# Patient Record
Sex: Male | Born: 2005 | Hispanic: No | Marital: Single | Smoking: Never smoker
Health system: Southern US, Community
[De-identification: ages and names within clinical notes are randomized; demographics above are authoritative.]

## PROBLEM LIST (undated history)

## (undated) DIAGNOSIS — T7840XA Allergy, unspecified, initial encounter: Secondary | ICD-10-CM

## (undated) DIAGNOSIS — J45909 Unspecified asthma, uncomplicated: Secondary | ICD-10-CM

## (undated) HISTORY — PX: TYMPANOSTOMY TUBE PLACEMENT: SHX32

---
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
## 2023-05-18 ENCOUNTER — Emergency Department (HOSPITAL_BASED_OUTPATIENT_CLINIC_OR_DEPARTMENT_OTHER): Payer: BC Managed Care – PPO

## 2023-05-18 ENCOUNTER — Encounter (HOSPITAL_BASED_OUTPATIENT_CLINIC_OR_DEPARTMENT_OTHER): Payer: Self-pay

## 2023-05-18 ENCOUNTER — Observation Stay (HOSPITAL_BASED_OUTPATIENT_CLINIC_OR_DEPARTMENT_OTHER)
Admission: EM | Admit: 2023-05-18 | Discharge: 2023-05-20 | Disposition: A | Discharge status: Home / Self Care | Payer: BC Managed Care – PPO | Attending: Internal Medicine | Admitting: Internal Medicine

## 2023-05-18 ENCOUNTER — Other Ambulatory Visit: Payer: Self-pay

## 2023-05-18 DIAGNOSIS — Z91010 Allergy to peanuts: Secondary | ICD-10-CM | POA: Insufficient documentation

## 2023-05-18 DIAGNOSIS — J982 Interstitial emphysema: Secondary | ICD-10-CM

## 2023-05-18 DIAGNOSIS — Z1152 Encounter for screening for COVID-19: Secondary | ICD-10-CM | POA: Diagnosis not present

## 2023-05-18 DIAGNOSIS — Z7289 Other problems related to lifestyle: Secondary | ICD-10-CM

## 2023-05-18 DIAGNOSIS — J939 Pneumothorax, unspecified: Secondary | ICD-10-CM | POA: Diagnosis not present

## 2023-05-18 DIAGNOSIS — J45901 Unspecified asthma with (acute) exacerbation: Secondary | ICD-10-CM | POA: Diagnosis not present

## 2023-05-18 DIAGNOSIS — R0602 Shortness of breath: Secondary | ICD-10-CM | POA: Diagnosis present

## 2023-05-18 DIAGNOSIS — Z79899 Other long term (current) drug therapy: Secondary | ICD-10-CM | POA: Insufficient documentation

## 2023-05-18 DIAGNOSIS — U070 Vaping-related disorder: Secondary | ICD-10-CM | POA: Insufficient documentation

## 2023-05-18 LAB — CBC
HCT: 42.5 % (ref 39.0–52.0)
Hemoglobin: 14.1 g/dL (ref 13.0–17.0)
MCH: 30.1 pg (ref 26.0–34.0)
MCHC: 33.2 g/dL (ref 30.0–36.0)
MCV: 90.8 fL (ref 80.0–100.0)
Platelets: 224 10*3/uL (ref 150–400)
RBC: 4.68 MIL/uL (ref 4.22–5.81)
RDW: 12.7 % (ref 11.5–15.5)
WBC: 10.2 10*3/uL (ref 4.0–10.5)
nRBC: 0 % (ref 0.0–0.2)

## 2023-05-18 LAB — COMPREHENSIVE METABOLIC PANEL
ALT: 20 U/L (ref 0–44)
AST: 36 U/L (ref 15–41)
Albumin: 4.6 g/dL (ref 3.5–5.0)
Alkaline Phosphatase: 115 U/L (ref 38–126)
Anion gap: 9 (ref 5–15)
BUN: 14 mg/dL (ref 6–20)
CO2: 26 mmol/L (ref 22–32)
Calcium: 9.5 mg/dL (ref 8.9–10.3)
Chloride: 102 mmol/L (ref 98–111)
Creatinine, Ser: 1.21 mg/dL (ref 0.61–1.24)
GFR, Estimated: >60 mL/min (ref >60)
Glucose, Bld: 100 mg/dL — ABNORMAL HIGH (ref 70–99)
Potassium: 4 mmol/L (ref 3.5–5.1)
Sodium: 137 mmol/L (ref 135–145)
Total Bilirubin: 1 mg/dL (ref 0.3–1.2)
Total Protein: 7.6 g/dL (ref 6.5–8.1)

## 2023-05-18 LAB — HIV ANTIBODY (ROUTINE TESTING W REFLEX): HIV Screen 4th Generation wRfx: NONREACTIVE

## 2023-05-18 LAB — RESPIRATORY PANEL BY PCR

## 2023-05-18 LAB — SARS CORONAVIRUS 2 BY RT PCR: SARS Coronavirus 2 by RT PCR: NEGATIVE

## 2023-05-18 MED ORDER — SODIUM CHLORIDE 0.9 % IV SOLN: INTRAVENOUS | Status: DC

## 2023-05-18 MED ORDER — ACETAMINOPHEN 325 MG PO TABS: 650.0000 mg | ORAL_TABLET | Freq: Four times a day (QID) | ORAL | Status: DC | PRN

## 2023-05-18 MED ORDER — KETOROLAC TROMETHAMINE 30 MG/ML IJ SOLN: 30.0000 mg | Freq: Four times a day (QID) | INTRAMUSCULAR | Status: DC | PRN

## 2023-05-18 MED ORDER — LORATADINE 10 MG PO TABS
10.0000 mg | ORAL_TABLET | Freq: Every evening | ORAL | Status: DC
  Administered 2023-05-19: 10 mg via ORAL
  Filled 2023-05-18: qty 1

## 2023-05-18 MED ORDER — ACETAMINOPHEN 650 MG RE SUPP: 650.0000 mg | Freq: Four times a day (QID) | RECTAL | Status: DC | PRN

## 2023-05-18 MED ORDER — ALBUTEROL SULFATE (2.5 MG/3ML) 0.083% IN NEBU
2.5000 mg | INHALATION_SOLUTION | RESPIRATORY_TRACT | Status: DC | PRN
  Administered 2023-05-18 – 2023-05-19 (×2): 2.5 mg via RESPIRATORY_TRACT
  Filled 2023-05-18 (×3): qty 3

## 2023-05-18 MED ORDER — LEVOCETIRIZINE DIHYDROCHLORIDE 5 MG PO TABS: 5.0000 mg | ORAL_TABLET | Freq: Every evening | ORAL | Status: DC

## 2023-05-18 MED ORDER — PREDNISONE 20 MG PO TABS
40.0000 mg | ORAL_TABLET | Freq: Every day | ORAL | Status: DC
  Administered 2023-05-19 – 2023-05-20 (×2): 40 mg via ORAL
  Filled 2023-05-18 (×2): qty 2

## 2023-05-18 MED ORDER — IOHEXOL 300 MG/ML  SOLN
75.0000 mL | Freq: Once | INTRAMUSCULAR | Status: AC | PRN
  Administered 2023-05-18: 75 mL via INTRAVENOUS

## 2023-05-18 MED ORDER — BUDESONIDE 0.5 MG/2ML IN SUSP
0.5000 mg | Freq: Two times a day (BID) | RESPIRATORY_TRACT | Status: DC
  Administered 2023-05-18 – 2023-05-20 (×5): 0.5 mg via RESPIRATORY_TRACT
  Filled 2023-05-18 (×5): qty 2

## 2023-05-18 MED ORDER — ENOXAPARIN SODIUM 40 MG/0.4ML IJ SOSY
40.0000 mg | PREFILLED_SYRINGE | INTRAMUSCULAR | Status: DC
  Filled 2023-05-18 (×2): qty 0.4

## 2023-05-18 MED ORDER — SODIUM CHLORIDE 0.9% FLUSH
3.0000 mL | Freq: Two times a day (BID) | INTRAVENOUS | Status: DC
  Administered 2023-05-18 – 2023-05-19 (×2): 3 mL via INTRAVENOUS

## 2023-05-18 MED ORDER — ONDANSETRON HCL 4 MG PO TABS: 4.0000 mg | ORAL_TABLET | Freq: Four times a day (QID) | ORAL | Status: DC | PRN

## 2023-05-18 MED ORDER — ONDANSETRON HCL 4 MG/2ML IJ SOLN: 4.0000 mg | Freq: Four times a day (QID) | INTRAMUSCULAR | Status: DC | PRN

## 2023-05-18 NOTE — ED Triage Notes (Signed)
Pt states he has a hx of asthma, was seen in [place] ([facility]) PTA He received a steroid injection and albuterol nebs. On the way home Mother was called to bring him to ED for CT  dx with pneumomediastinum. Pt states breathing is better

## 2023-05-18 NOTE — Progress Notes (Signed)
Patient refused his lovenox subcutaneous shot. Patient and mom was educated on their importance and indication. Patient verbalized understanding of education and states that he will walk more often and will walk in the hallway more often. [name], DO was notified.   Lisbeth Puller

## 2023-05-18 NOTE — Progress Notes (Signed)
     [address]       [place] [address]             [phone]       Full consult to follow CT reviewed 17yo male with pneumomediastinum.  Recs: Esophagram to rule out perforation.  If negative, PO challenge.

## 2023-05-18 NOTE — Care Management (Signed)
Transition of Care [facility]) Screening Note   Patient Details  Name: Naiden Barstow Date of Birth: 26-Sep-2005   Transition of Care [facility]) CM/SW Contact:    Lockie Pares, RN Phone Number: 05/18/2023, 12:58 PM    Transition of Care Department [facility]) has reviewed patient and no TOC needs have been identified at this time. We will continue to monitor patient advancement through interdisciplinary progression rounds. If new patient transition needs arise, please place a TOC consult.

## 2023-05-18 NOTE — ED Notes (Signed)
Consult Hosptialist to [facility] @ 05:47 am

## 2023-05-18 NOTE — ED Provider Notes (Signed)
Admitted to Dr Katrinka Blazing hospitalist - plan for observation admission for trace PTX and pneumomediastinum, on telemetry.  O2 sat okay here.   Terald Sleeper, MD 05/18/23 [phone]

## 2023-05-18 NOTE — Progress Notes (Signed)
   05/18/23 1500  Spiritual Encounters  Type of Visit Initial  Care provided to: Pt and family  Referral source Patient request  Reason for visit Routine spiritual support  OnCall Visit No   Ch responded to request for emotional and spiritual support. Pt's mother was at bedside. Pt said he  is feeling better now. It was a scary moment for the family. Pt's mother has not had any sleep since yesterday. Ch provided hospitality and asked guided questions to bring forth feelings. Ch remains available when needed.   Chaplain Val Eydan Chianese, M.Div.

## 2023-05-18 NOTE — Plan of Care (Signed)

## 2023-05-18 NOTE — H&P (Signed)
History and Physical    Patient: Miguel Bond ZOX:[id number] DOB: Oct 04, 2005 DOA: 05/18/2023 DOS: the patient was seen and examined on 05/18/2023 PCP: Garey Ham, MD  Patient coming from: Transfer from [facility]  Chief Complaint:  Chief Complaint  Patient presents with   Shortness of Breath   Chest Pain   HPI: Miguel Bond is a 17 y.o. male with medical history significant of asthma who presents with complaints of shortness of breath.  Symptoms started yesterday evening at around 7 PM.  He reported having acute onset of chest tightness along with shortness of breath and wheezing.  He had been having mild intermittent cough and wheezing.  He had tried his inhaler along with several breathing treatments at home without improvement in symptoms.  He was initially seen at [facility] emergency department and given steroid shot and breathing treatment with some improvement.  He had initially been discharged.  However, shortly thereafter he was called about the chest x-ray showing concern for pneumomediastinum.  He was advised to go to emergency department immediately.  Patient does admit to intermittent vape use and marijuana use.  Denies having any recent fevers, nausea, vomiting, diarrhea, or dysuria symptoms.  Upon further questioning patient did make note that he had gotten hit during football with with his neck strap.  In [facility] department at [facility] center patient was noted to have stable vital signs.  Labs are unremarkable.  CT chest confirms moderate volume pneumomediastinum thought secondary to small pneumothorax versus esophageal tear.  Possibly brought on secondary to patient coughing.  Cardiothoracic surgery have been consulted.  Review of Systems: As mentioned in the history of present illness. All other systems reviewed and are negative. Past Medical History:  Diagnosis Date   Allergy    Asthma    Past Surgical History:  Procedure Laterality Date   ORIF ORBITAL FRACTURE Left  03/05/2021   Procedure: OPEN REDUCTION INTERNAL FIXATION LEFT ZYGOMATICOMAXILLARY COMPLEX FRACTURE AND CLOSED REDUCTION OF LEFT ORBITAL FRACTURE;  Surgeon: Laren Boom, DO;  Location: [facility] OR;  Service: ENT;  Laterality: Left;   TYMPANOSTOMY TUBE PLACEMENT     17 yo   Social History:  reports that he has never smoked. He has never been exposed to tobacco smoke. He has never used smokeless tobacco. He reports current drug use. Drug: Marijuana. He reports that he does not drink alcohol.  Allergies  Allergen Reactions   Other Hives and Anaphylaxis    TREE NUTS   Peanut-Containing Drug Products Anaphylaxis and Hives    History reviewed. No pertinent family history.  Prior to Admission medications   Medication Sig Start Date End Date Taking? Authorizing Provider  acetaminophen (TYLENOL) 500 MG tablet Take 500 mg by mouth 2 (two) times daily as needed for moderate pain.    [provider]  albuterol (PROVENTIL) (2.5 MG/3ML) 0.083% nebulizer solution Take 2.5 mg by nebulization every 6 (six) hours as needed for wheezing or shortness of breath.    [provider]  albuterol (VENTOLIN HFA) 108 (90 Base) MCG/ACT inhaler Inhale 2 puffs into the lungs every 6 (six) hours as needed for wheezing or shortness of breath.    [provider]  beclomethasone (QVAR) 80 MCG/ACT inhaler Inhale 1 puff into the lungs 2 (two) times daily.    [provider]  EPINEPHrine 0.3 mg/0.3 mL IJ SOAJ injection Inject 0.3 mg into the muscle Once PRN.    [provider]  loratadine (CLARITIN) 10 MG tablet Take 10 mg by  mouth daily.    [provider]  Multiple Vitamins-Minerals (MULTIVITAMIN WITH MINERALS) tablet Take 1 tablet by mouth daily.    [provider]    Physical Exam: Vitals:   05/18/23 0535 05/18/23 0630 05/18/23 1037 05/18/23 1210  BP: 133/83 (!) 137/54 (!) 144/90 123/78  Pulse: 87 81 67 62  Resp: 14 14 13 17   Temp:  98.2 F (36.8 C) 98.5  F (36.9 C) 98.4 F (36.9 C)  TempSrc:  Oral Oral Oral  SpO2: 95% 96% 98% 95%  Weight:      Height:       Constitutional: Young male currently in no acute distress. Eyes: PERRL, lids and conjunctivae normal ENMT: Mucous membranes are moist. Posterior pharynx clear of any exudate or lesions.Normal dentition.  Neck: normal, supple, no masses, no thyromegaly Respiratory: clear to auscultation bilaterally, no wheezing, no crackles. Normal respiratory effort. No accessory muscle use.  Cardiovascular: Regular rate and rhythm, no murmurs / rubs / gallops. No extremity edema. 2+ pedal pulses. No carotid bruits.  Abdomen: no tenderness, no masses palpated. No hepatosplenomegaly. Bowel sounds positive.  Musculoskeletal: no clubbing / cyanosis. No joint deformity upper and lower extremities. Good ROM, no contractures. Normal muscle tone.  Skin: no rashes, lesions, ulcers. No induration Neurologic: CN 2-12 grossly intact. Sensation intact, DTR normal. Strength 5/5 in all 4.  Psychiatric: Normal judgment and insight. Alert and oriented x 3. Normal mood.   Data Reviewed:  Reviewed labs, imaging, and pertinent records as documented.  Assessment and Plan: Pneumomediastinum  Atraumatic pneumothorax Acute.  Patient present with complaints of shortness of breath, wheezing, and chest tightness.  Found to have concern for moderate mediastinum with trace left apicomedial pneumothorax appreciated, and slightly air distention of the esophagus although small perforation of the esophagus to be missed on imaging.  Cardiothoracic surgery have been consulted and recommended barium swallow.. -Admit to medical telemetry bed -Continuous pulse oximetry with nasal cannula oxygen -Incentive spirometry -N.p.o. for need of barium swallow -Cardiothoracic surgery consulted,   will follow-up for any further recommendations  Asthma exacerbation Patient reported having wheezing with cough and shortness of breath yesterday.   He has been given steroids and breathing treatments prior to arrival.  No significant wheezing appreciated on physical exam at this time. -Continue albuterol nebs as needed for shortness of breath/wheezing -Brovana nebs  Vape use Patient reports intermittent vape use. -Counseled patient on need of cessation of vape use.   DVT prophylaxis: Lovenox Advance Care Planning:   Code Status: Full Code    Consults: Cardiothoracic surgery  Family Communication: Patient's mother updated at patient's bedside  Severity of Illness: The appropriate patient status for this patient is OBSERVATION. Observation status is judged to be reasonable and necessary in order to provide the required intensity of service to ensure the patient's safety. The patient's presenting symptoms, physical exam findings, and initial radiographic and laboratory data in the context of their medical condition is felt to place them at decreased risk for further clinical deterioration. Furthermore, it is anticipated that the patient will be medically stable for discharge from the hospital within 2 midnights of admission.   Author: Clydie Braun, MD 05/18/2023 12:18 PM  For on call review [email or web address].

## 2023-05-18 NOTE — Consult Note (Signed)
[address]       [place] [address]             [phone]        Miguel Bond [facility] Medical Record #[id number] Date of Birth: Nov 13, 2005  Referring: [facility] Primary Care: Dial, Jon Billings, MD Primary Cardiologist:None  Chief Complaint:    Chief Complaint  Patient presents with   Shortness of Breath   Chest Pain   History of Present Illness:      Miguel Bond os a 17 yo male with history of Asthma.  He was evaluated at [facility].  He was treated with a steroid injection and nebulizer therapy.  After discharge his mother was contacted and instructed to bring the patient to the ED due to the finding of pneumomediastinum on his CXR.  He presented to [facility] ED for evaluation.  Upon arrival he felt his breathing was better.  CT scan was obtained and confirmed the presence of pneumomediastinum, however esophageal perforation can not be ruled out.  The patient denies any N/V.  He did state that he got hit in the neck with a chin strap at football practice yesterday.  Current Activity/ Functional Status: Patient is independent with mobility/ambulation, transfers, ADL's, IADL's.   Past Medical History:  Diagnosis Date   Allergy    Asthma     Past Surgical History:  Procedure Laterality Date   ORIF ORBITAL FRACTURE Left 03/05/2021   Procedure: OPEN REDUCTION INTERNAL FIXATION LEFT ZYGOMATICOMAXILLARY COMPLEX FRACTURE AND CLOSED REDUCTION OF LEFT ORBITAL FRACTURE;  Surgeon: Laren Boom, DO;  Location: [facility] OR;  Service: ENT;  Laterality: Left;   TYMPANOSTOMY TUBE PLACEMENT     17 yo    Social History   Tobacco Use  Smoking Status Never   Passive exposure: Never  Smokeless Tobacco Never    Social History   Substance and Sexual Activity  Alcohol Use Never     Allergies  Allergen Reactions   Other Hives and Anaphylaxis    TREE NUTS   Peanut-Containing Drug Products Anaphylaxis and Hives    Current  Facility-Administered Medications  Medication Dose Route Frequency Provider Last Rate Last Admin   acetaminophen (TYLENOL) tablet 650 mg  650 mg Oral Q6H PRN Clydie Braun, MD       Or   acetaminophen (TYLENOL) suppository 650 mg  650 mg Rectal Q6H PRN Madelyn Flavors A, MD       albuterol (PROVENTIL) (2.5 MG/3ML) 0.083% nebulizer solution 2.5 mg  2.5 mg Nebulization Q2H PRN Katrinka Blazing, Rondell A, MD   2.5 mg at 05/18/23 1254   enoxaparin (LOVENOX) injection 40 mg  40 mg Subcutaneous Q24H Smith, Rondell A, MD       ketorolac (TORADOL) 30 MG/ML injection 30 mg  30 mg Intravenous Q6H PRN Katrinka Blazing, Rondell A, MD       ondansetron (ZOFRAN) tablet 4 mg  4 mg Oral Q6H PRN Madelyn Flavors A, MD       Or   ondansetron (ZOFRAN) injection 4 mg  4 mg Intravenous Q6H PRN Smith, Rondell A, MD       sodium chloride flush (NS) 0.9 % injection 3 mL  3 mL Intravenous Q12H Smith, Rondell A, MD   3 mL at 05/18/23 1342    Medications Prior to Admission  Medication Sig Dispense Refill Last Dose   albuterol (PROVENTIL) (2.5 MG/3ML) 0.083% nebulizer solution Take 2.5 mg by nebulization every 6 (six)  hours as needed for wheezing or shortness of breath.   05/18/2023   albuterol (VENTOLIN HFA) 108 (90 Base) MCG/ACT inhaler Inhale 2 puffs into the lungs every 6 (six) hours as needed for wheezing or shortness of breath.   05/17/2023   EPINEPHrine 0.3 mg/0.3 mL IJ SOAJ injection Inject 0.3 mg into the muscle Once PRN.   Unk   levocetirizine (XYZAL) 5 MG tablet Take 5 mg by mouth every evening.   05/17/2023   beclomethasone (QVAR) 80 MCG/ACT inhaler Inhale 1 puff into the lungs 2 (two) times daily.      predniSONE (DELTASONE) 20 MG tablet Take 40 mg by mouth daily.       History reviewed. No pertinent family history.   Review of Systems:   ROS    Cardiac Review of Systems: Y or  [    ]= no  Chest Pain [ Y, improved   ]  Resting SOB [   ] Exertional SOB  [  ]  Orthopnea [  ]   Pedal Edema [   ]    Palpitations [  ] Syncope   [  ]   Presyncope [   ]  General Review of Systems: [Y] = yes [  ]=no Constitional: recent weight change [  ]; anorexia [  ]; fatigue [ N ]; nausea Miguel Bond  ]; night sweats [  ]; fever Miguel Bond  ]; or chills [  ]                                                               Dental: Last Dentist visit:   Eye : blurred vision [  ]; diplopia [   ]; vision changes [  ];  Amaurosis fugax[  ]; Resp: cough [ Y, resolved ];  wheezing[ resolved  ];  hemoptysis[  ]; shortness of breath[ resolved ]; paroxysmal nocturnal dyspnea[  ]; dyspnea on exertion[  ]; or orthopnea[  ];  GI:  gallstones[  ], vomiting[N  ];  dysphagia[  ]; melena[  ];  hematochezia [  ]; heartburn[  ];   Hx of  Colonoscopy[  ]; GU: kidney stones [  ]; hematuria[  ];   dysuria [  ];  nocturia[  ];  history of     obstruction [  ]; urinary frequency [  ]             Skin: rash, swelling[ N ];, hair loss[  ];  peripheral edema[  ];  or itching[  ]; Musculosketetal: myalgias[  ];  joint swelling[  ];  joint erythema[  ];  joint pain[  ];  back pain[  ];  Heme/Lymph: bruising[  ];  bleeding[  ];  anemia[  ];  Neuro: TIA[  ];  headaches[  ];  stroke[  ];  vertigo[  ];  seizures[  ];   paresthesias[  ];  difficulty walking[  ];  Psych:depression[  ]; anxiety[  ];    Physical Exam: BP 123/78 (BP Location: Left Arm)   Pulse 62   Temp 98.4 F (36.9 C) (Oral)   Resp 17   Ht 6\' 1"  (1.854 m)   Wt 79.4 kg   SpO2 95%   BMI 23.09 kg/m    General appearance: alert, cooperative,  and no distress Head: Normocephalic, without obvious abnormality Resp: clear to auscultation bilaterally Cardio: regular rate and rhythm Neurologic: Grossly normal  Diagnostic Studies & Laboratory data:     Recent Radiology Findings:   CT Chest W Contrast  Result Date: 05/18/2023 CLINICAL DATA:  Recent diagnosis of pneumomediastinum. Possible esophageal perforation. Shortness of breath and chest pain. 17 year old male. EXAM: CT CHEST WITH CONTRAST TECHNIQUE: Multidetector  CT imaging of the chest was performed during intravenous contrast administration. RADIATION DOSE REDUCTION: This exam was performed according to the departmental dose-optimization program which includes automated exposure control, adjustment of the mA and/or kV according to patient size and/or use of iterative reconstruction technique. CONTRAST:  75mL OMNIPAQUE IOHEXOL 300 MG/ML  SOLN COMPARISON:  Last PA and lateral chest was 12/01/2020. No prior cross-sectional imaging for comparison. FINDINGS: Cardiovascular: No significant vascular findings. Normal heart size. No pericardial effusion. Mediastinum/Nodes: There is moderate pneumomediastinum outlining structures in the anterior, superior and the middle mediastinum including surrounding the thoracic esophagus, the intrathoracic great vessels and both main bronchi as well as tracking into the prevascular space with trace amount anterior to the heart. Source of the air is indeterminate. The walls of the trachea and main bronchi are grossly intact. There is no visible defect in the esophagus which is slightly air distended, although a small perforation could be occult on imaging. Lungs/Pleura: No pleural effusion, thickening or pneumothorax. Trace left apicomedial pneumothorax is noted involving approximately 1% of the left chest volume, without mediastinal shift. No right pneumothorax is seen. There is diffuse bronchial thickening. There are small areas of ground-glass disease in the base of the right middle lobe medially and in the medial basal left lower lobe, which could be areas of ground-glass pneumonitis or post pneumonic ground-glass scarring. If there was history of recent chest trauma these could be areas of contusion. The remaining lungs are otherwise entirely clear. Upper Abdomen: No acute abnormality. No free air is seen in the upper abdomen. Musculoskeletal: No regional skeletal fracture is evident. No other significant osseous findings. The chest wall is  unremarkable. IMPRESSION: 1. Moderate pneumomediastinum outlining the thoracic esophagus, the intrathoracic great vessels and both main bronchi as well as tracking into the prevascular space with trace amount anterior to the heart. Source of the air is indeterminate. No visible defect in the esophagus which is slightly air distended, although a small perforation could be occult on imaging. 2. Trace left apicomedial pneumothorax involving approximately 1% of the left chest volume, without mediastinal shift. 3. Small areas of ground-glass disease in the base of the right middle lobe medially and in the medial basal left lower lobe, which could be areas of ground-glass pneumonitis or post pneumonic ground-glass scarring. If there was history of recent chest trauma these could be areas of contusion. 4. Diffuse bronchial thickening. Electronically Signed   By: Almira Bar M.D.   On: 05/18/2023 05:06     I have independently reviewed the above radiologic studies and discussed with the patient   Recent Lab Findings: Lab Results  Component Value Date   WBC 10.2 05/18/2023   HGB 14.1 05/18/2023   HCT 42.5 05/18/2023   PLT 224 05/18/2023   GLUCOSE 100 (H) 05/18/2023   ALT 20 05/18/2023   AST 36 05/18/2023   NA 137 05/18/2023   K 4.0 05/18/2023   CL 102 05/18/2023   CREATININE 1.21 05/18/2023   BUN 14 05/18/2023   CO2 26 05/18/2023      Assessment / Plan:  Pneumomediastinum-likely due to asthma exacerbation.Marland Kitchen However, keep patient NPO, will need Esophogram to rule out esophageal injury.  If this is negative, okay to advance diet, no surgical intervention indicated at this time  I  spent 20 minutes counseling the patient face to face.   Lowella Dandy, PA-C 05/18/2023 3:18 PM

## 2023-05-18 NOTE — Plan of Care (Signed)
Transfer from [facility]. Miguel Bond is a 17 year old male with PMH of asthma who presented with complaints of shortness of breath and tightness in his chest.  Labs are unremarkable.  CT chest confirms moderate volume pneumomediastinum thought secondary to small pneumothorax.  Possibly brought on secondary to patient coughing.  Cardiothoracic surgery had been consulted Dr. Leafy Ro.  Recommended observation.  Please notify cardiothoracic surgery upon arrival.  Orders placed for oxygen patient in a medical telemetry bed

## 2023-05-18 NOTE — ED Provider Notes (Signed)
[facility] Emergency Department Provider Note MRN:  [id number]  Arrival date & time: 05/18/23     Chief Complaint   Shortness of Breath and Chest Pain   History of Present Illness   Gracin Daws is a 17 y.o. year-old male with a history of asthma presenting to the ED with chief complaint of shortness of breath.  Shortness of breath and chest tightness starting yesterday.  Was treated at an emergency department in [place], given breathing treatments and a steroid shot.  Discharged home but on the way home was called back by the emergency department staff, told that his x-ray showed pneumomediastinum, advised him to go back to the emergency department for CT imaging.  Review of Systems  A thorough review of systems was obtained and all systems are negative except as noted in the HPI and PMH.   Patient's Health History    Past Medical History:  Diagnosis Date   Allergy    Asthma     Past Surgical History:  Procedure Laterality Date   ORIF ORBITAL FRACTURE Left 03/05/2021   Procedure: OPEN REDUCTION INTERNAL FIXATION LEFT ZYGOMATICOMAXILLARY COMPLEX FRACTURE AND CLOSED REDUCTION OF LEFT ORBITAL FRACTURE;  Surgeon: Laren Boom, DO;  Location: [facility] OR;  Service: ENT;  Laterality: Left;   TYMPANOSTOMY TUBE PLACEMENT     17 yo    History reviewed. No pertinent family history.  Social History   Socioeconomic History   Marital status: Single    Spouse name: Not on file   Number of children: Not on file   Years of education: Not on file   Highest education level: Not on file  Occupational History   Not on file  Tobacco Use   Smoking status: Never    Passive exposure: Never   Smokeless tobacco: Never  Vaping Use   Vaping status: Every Day  Substance and Sexual Activity   Alcohol use: Never   Drug use: Yes    Types: Marijuana    Comment: occ   Sexual activity: Not on file  Other Topics Concern   Not on file  Social History Narrative    Not on file   Social Determinants of Health   Financial Resource Strain: Not on file  Food Insecurity: Not on file  Transportation Needs: Not on file  Physical Activity: Not on file  Stress: Not on file  Social Connections: Not on file  Intimate Partner Violence: Not on file     Physical Exam   Vitals:   05/18/23 0345 05/18/23 0535  BP: 137/75 133/83  Pulse: 61 87  Resp: 18 14  Temp:    SpO2: 100% 95%    CONSTITUTIONAL: Well-appearing, NAD NEURO/PSYCH:  Alert and oriented x 3, no focal deficits EYES:  eyes equal and reactive ENT/NECK:  no LAD, no JVD CARDIO: Regular rate, well-perfused, normal S1 and S2 PULM:  CTAB no wheezing or rhonchi GI/GU:  non-distended, non-tender MSK/SPINE:  No gross deformities, no edema SKIN:  no rash, atraumatic   *Additional and/or pertinent findings included in MDM below  Diagnostic and Interventional Summary    EKG Interpretation Date/Time:  Thursday May 18 2023 02:24:59 EDT Ventricular Rate:  83 PR Interval:  130 QRS Duration:  80 QT Interval:  349 QTC Calculation: 410 R Axis:   86  Text Interpretation: Sinus arrhythmia Confirmed by Kennis Carina [phone]) on 05/18/2023 4:07:44 AM       Labs Reviewed  COMPREHENSIVE METABOLIC PANEL - Abnormal; Notable for the following  components:      Result Value   Glucose, Bld 100 (*)    All other components within normal limits  CBC    CT Chest W Contrast  Final Result      Medications  iohexol (OMNIPAQUE) 300 MG/ML solution 75 mL (75 mLs Intravenous Contrast Given 05/18/23 0413)     Procedures  /  Critical Care Procedures  ED Course and Medical Decision Making  Initial Impression and Ddx Patient is sitting comfortably in no acute distress with normal vital signs, lungs are largely clear.  Differential diagnosis includes pneumomediastinum, pneumothorax, awaiting labs, CT.  Past medical/surgical history that increases complexity of ED encounter: Asthma  Interpretation  of Diagnostics I personally reviewed the EKG and my interpretation is as follows: Sinus rhythm  Labs overall reassuring with no significant blood count or electrolyte disturbance.  CT imaging confirms moderate volume pneumomediastinum.  Patient Reassessment and Ultimate Disposition/Management     Case discussed with Dr. Leafy Ro of CT surgery, given the history and CT appearance likely all stemming from a small pneumothorax.  Not much to do other than hospitalist admission for observation.  Awaiting hospitalist formal acceptance.  Patient management required discussion with the following services or consulting groups:  Hospitalist Service  Complexity of Problems Addressed Acute illness or injury that poses threat of life of bodily function  Additional Data Reviewed and Analyzed Further history obtained from: Further history from spouse/family member  Additional Factors Impacting ED Encounter Risk Consideration of hospitalization  Elmer Sow. Pilar Plate, MD [facility] Emergency [facility] [email or web address]  Final Clinical Impressions(s) / ED Diagnoses     ICD-10-CM   1. Pneumomediastinum (HCC)  J98.2       ED Discharge Orders     None        Discharge Instructions Discussed with and Provided to Patient:   Discharge Instructions   None      Sabas Sous, MD 05/18/23 [phone]

## 2023-05-19 ENCOUNTER — Observation Stay (HOSPITAL_COMMUNITY): Payer: BC Managed Care – PPO

## 2023-05-19 DIAGNOSIS — J982 Interstitial emphysema: Secondary | ICD-10-CM | POA: Diagnosis not present

## 2023-05-19 LAB — CBC
HCT: 43.7 % (ref 39.0–52.0)
Hemoglobin: 14.2 g/dL (ref 13.0–17.0)
MCH: 29.6 pg (ref 26.0–34.0)
MCHC: 32.5 g/dL (ref 30.0–36.0)
MCV: 91.2 fL (ref 80.0–100.0)
Platelets: 236 10*3/uL (ref 150–400)
RBC: 4.79 MIL/uL (ref 4.22–5.81)
RDW: 12.7 % (ref 11.5–15.5)
WBC: 6.5 10*3/uL (ref 4.0–10.5)
nRBC: 0 % (ref 0.0–0.2)

## 2023-05-19 NOTE — Plan of Care (Signed)

## 2023-05-19 NOTE — Progress Notes (Signed)
Patient refused red med (Lovenox subcutaneous). Patient was educated on their indications, importance and their risks. Patient verbalized understanding. [name], DO was notified.   Miguel Bond

## 2023-05-19 NOTE — Progress Notes (Signed)
PROGRESS NOTE  Miguel Bond  WGN:[id number] DOB: 2006-04-06 DOA: 05/18/2023 PCP: Garey Ham, MD   Brief Narrative: Patient is a 17 year old male with history of asthma who presented with shortness of breath, acute onset of chest tightness, wheezing, intermittent cough.  Chest x-ray done as an outpatient was concerning for pneumomediastinum and was advised to go to the emergency department.He has  history of intermittent vaping use/marijuana use.  On presentation, he was hemodynamically stable .  CT chest confirmed moderate volume pneumomediastinum, small pneumothorax on the left.  Cardiothoracic surgery consulted.  Esophagram did not show any abnormalities.  Diet restarted.  Plan for monitoring today, possible discharge tomorrow after cardiothoracic surgery clearance  Assessment & Plan:  Principal Problem:   Pneumomediastinum (HCC) Active Problems:   Pneumothorax   Asthma exacerbation   Vapes non-nicotine containing substance  Pneumomediastinum/traumatic pneumothorax: Presented with shortness of breath, wheezing, chest tightness.  Chest CT showed moderate pneumomediastinum, trace apical pneumothorax.  Etiology unclear.  Cardiothoracic surgery consulted and following.   Esophagram did not show any abnormalities.  Diet restarted.  Plan for monitoring today, possible discharge tomorrow after cardiothoracic surgery clearance. He is completely asymptomatic and on room air.  Asthma exacerbation: Presented with cough, wheezing, shortness of breath.  Continue incentive centimeter, breathing treatment.  Currently not wheezing.  Intermittent vape/marijuana use: Counseled for cessation         DVT prophylaxis:enoxaparin (LOVENOX) injection 40 mg Start: 05/18/23 2200     Code Status: Full Code  Family Communication: Discussed with mother at bedside  Patient status: Obs  Patient is from :Home  Anticipated discharge [facility]  Estimated DC date:tomorrow   Consultants:  CTCS  Procedures:None  Antimicrobials:  Anti-infectives (From admission, onward)    None       Subjective: Patient seen and examined at bedside today.  Hemodynamically stable on room air.  Any shortness of breath or cough.  Very comfortable.  Objective: Vitals:   05/18/23 2047 05/18/23 2324 05/19/23 0434 05/19/23 0556  BP:  126/60 126/73   Pulse:  (!) 57 64   Resp:      Temp:  98 F (36.7 C) 98.3 F (36.8 C)   TempSrc:  Oral Oral   SpO2: 96% 96% 95% 98%  Weight:      Height:        Intake/Output Summary (Last 24 hours) at 05/19/2023 0739 Last data filed at 05/19/2023 0731 Gross per 24 hour  Intake 859.65 ml  Output --  Net 859.65 ml   Filed Weights   05/18/23 0231  Weight: 79.4 kg    Examination:  General exam: Overall comfortable, not in distress HEENT: PERRL Respiratory system:  no wheezes or crackles  Cardiovascular system: S1 & S2 heard, RRR.  Gastrointestinal system: Abdomen is nondistended, soft and nontender. Central nervous system: Alert and oriented Extremities: No edema, no clubbing ,no cyanosis Skin: No rashes, no ulcers,no icterus     Data Reviewed: I have personally reviewed following labs and imaging studies  CBC: Recent Labs  Lab 05/18/23 0300  WBC 10.2  HGB 14.1  HCT 42.5  MCV 90.8  PLT 224   Basic Metabolic Panel: Recent Labs  Lab 05/18/23 0300  NA 137  K 4.0  CL 102  CO2 26  GLUCOSE 100*  BUN 14  CREATININE 1.21  CALCIUM 9.5     Recent Results (from the past 240 hour(s))  Respiratory (~20 pathogens) panel by PCR     Status: Abnormal   Collection Time: 05/18/23  2:00 PM   Specimen: Nasopharyngeal Swab; Respiratory  Result Value Ref Range Status   Adenovirus NOT DETECTED NOT DETECTED Final   Coronavirus 229E NOT DETECTED NOT DETECTED Final    Comment: (NOTE) The Coronavirus on the Respiratory Panel, DOES NOT test for the novel  Coronavirus (2019 nCoV)    Coronavirus HKU1 NOT DETECTED NOT DETECTED Final    Coronavirus NL63 NOT DETECTED NOT DETECTED Final   Coronavirus OC43 NOT DETECTED NOT DETECTED Final   Metapneumovirus NOT DETECTED NOT DETECTED Final   Rhinovirus / Enterovirus DETECTED (A) NOT DETECTED Final   Influenza A NOT DETECTED NOT DETECTED Final   Influenza B NOT DETECTED NOT DETECTED Final   Parainfluenza Virus 1 NOT DETECTED NOT DETECTED Final   Parainfluenza Virus 2 NOT DETECTED NOT DETECTED Final   Parainfluenza Virus 3 NOT DETECTED NOT DETECTED Final   Parainfluenza Virus 4 NOT DETECTED NOT DETECTED Final   Respiratory Syncytial Virus NOT DETECTED NOT DETECTED Final   Bordetella pertussis NOT DETECTED NOT DETECTED Final   Bordetella Parapertussis NOT DETECTED NOT DETECTED Final   Chlamydophila pneumoniae NOT DETECTED NOT DETECTED Final   Mycoplasma pneumoniae NOT DETECTED NOT DETECTED Final    Comment: Performed at [facility], [address], [place], [place] [address]  SARS Coronavirus 2 by RT PCR (hospital order, performed in [facility] hospital lab) *cepheid single result test* Anterior Nasal Swab     Status: None   Collection Time: 05/18/23  6:02 PM   Specimen: Anterior Nasal Swab  Result Value Ref Range Status   SARS Coronavirus 2 by RT PCR NEGATIVE NEGATIVE Final    Comment: Performed at [facility], [address] [place], [place] [address]     Radiology Studies: CT Chest W Contrast  Result Date: 05/18/2023 CLINICAL DATA:  Recent diagnosis of pneumomediastinum. Possible esophageal perforation. Shortness of breath and chest pain. 17 year old male. EXAM: CT CHEST WITH CONTRAST TECHNIQUE: Multidetector CT imaging of the chest was performed during intravenous contrast administration. RADIATION DOSE REDUCTION: This exam was performed according to the departmental dose-optimization program which includes automated exposure control, adjustment of the mA and/or kV according to patient size and/or use of iterative reconstruction technique. CONTRAST:  75mL  OMNIPAQUE IOHEXOL 300 MG/ML  SOLN COMPARISON:  Last PA and lateral chest was 12/01/2020. No prior cross-sectional imaging for comparison. FINDINGS: Cardiovascular: No significant vascular findings. Normal heart size. No pericardial effusion. Mediastinum/Nodes: There is moderate pneumomediastinum outlining structures in the anterior, superior and the middle mediastinum including surrounding the thoracic esophagus, the intrathoracic great vessels and both main bronchi as well as tracking into the prevascular space with trace amount anterior to the heart. Source of the air is indeterminate. The walls of the trachea and main bronchi are grossly intact. There is no visible defect in the esophagus which is slightly air distended, although a small perforation could be occult on imaging. Lungs/Pleura: No pleural effusion, thickening or pneumothorax. Trace left apicomedial pneumothorax is noted involving approximately 1% of the left chest volume, without mediastinal shift. No right pneumothorax is seen. There is diffuse bronchial thickening. There are small areas of ground-glass disease in the base of the right middle lobe medially and in the medial basal left lower lobe, which could be areas of ground-glass pneumonitis or post pneumonic ground-glass scarring. If there was history of recent chest trauma these could be areas of contusion. The remaining lungs are otherwise entirely clear. Upper Abdomen: No acute abnormality. No free air is seen  in the upper abdomen. Musculoskeletal: No regional skeletal fracture is evident. No other significant osseous findings. The chest wall is unremarkable. IMPRESSION: 1. Moderate pneumomediastinum outlining the thoracic esophagus, the intrathoracic great vessels and both main bronchi as well as tracking into the prevascular space with trace amount anterior to the heart. Source of the air is indeterminate. No visible defect in the esophagus which is slightly air distended, although a small  perforation could be occult on imaging. 2. Trace left apicomedial pneumothorax involving approximately 1% of the left chest volume, without mediastinal shift. 3. Small areas of ground-glass disease in the base of the right middle lobe medially and in the medial basal left lower lobe, which could be areas of ground-glass pneumonitis or post pneumonic ground-glass scarring. If there was history of recent chest trauma these could be areas of contusion. 4. Diffuse bronchial thickening. Electronically Signed   By: Almira Bar M.D.   On: 05/18/2023 05:06    Scheduled Meds:  budesonide  0.5 mg Nebulization BID   enoxaparin (LOVENOX) injection  40 mg Subcutaneous Q24H   loratadine  10 mg Oral QPM   predniSONE  40 mg Oral Daily   sodium chloride flush  3 mL Intravenous Q12H   Continuous Infusions:  sodium chloride 75 mL/hr at 05/19/23 0731     LOS: 0 days   Burnadette Pop, MD [facility] P9/20/2024, 7:39 AM

## 2023-05-20 DIAGNOSIS — J982 Interstitial emphysema: Secondary | ICD-10-CM | POA: Diagnosis not present

## 2023-05-20 NOTE — Plan of Care (Signed)

## 2023-05-20 NOTE — Discharge Summary (Signed)
Physician Discharge Summary  Kary Frontino ZOX:[id number] DOB: 2006/06/16 DOA: 05/18/2023  PCP: Garey Ham, MD  Admit date: 05/18/2023 Discharge date: 05/20/2023  Admitted From: Home Disposition:  Home  Discharge Condition:Stable CODE STATUS:FULL Diet recommendation:  Regular   Brief/Interim Summary: Patient is a 17 year old male with history of asthma who presented with shortness of breath, acute onset of chest tightness, wheezing, intermittent cough.  Chest x-ray done as an outpatient was concerning for pneumomediastinum and was advised to go to the emergency department.He has  history of intermittent vaping use/marijuana use.  On presentation, he was hemodynamically stable .  CT chest confirmed moderate volume pneumomediastinum, small pneumothorax on the left.  Cardiothoracic surgery consulted.  Esophagram did not show any abnormalities.  Diet restarted and he has tolerated.  He is completely asymptomatic today.  On room air, denies any chest pain or shortness of breath.  Very eager to go home.  Cardiothoracic surgery has given clearance for discharge and he does not need follow-up.  Medically stable for discharge  Following problems were addressed during the hospitalization:  Pneumomediastinum/traumatic pneumothorax: Presented with shortness of breath, wheezing, chest tightness.  Chest CT showed moderate pneumomediastinum, trace apical pneumothorax.  Etiology unclear.  Cardiothoracic surgery consulted and  were following.   Esophagram did not show any abnormalities.  Diet restarted and he has tolerated.  He is completely asymptomatic today.  On room air, denies any chest pain or shortness of breath.  Very eager to go home.  Cardiothoracic surgery(discussed with Dr. Cliffton Asters) has given clearance for discharge and he does not need follow-up.  Medically stable for discharge   Asthma exacerbation: Presented with cough, wheezing, shortness of breath.  Currently not wheezing.  Taking prednisone,  inhalers at home  Intermittent vape/marijuana use: Counseled for cessation Discharge Diagnoses:  Principal Problem:   Pneumomediastinum (HCC) Active Problems:   Pneumothorax   Asthma exacerbation   Vapes non-nicotine containing substance    Discharge Instructions  Discharge Instructions     Diet general   Complete by: As directed    Discharge instructions   Complete by: As directed    1)Please follow up with your PCP in a week 2)Please stop vaping or smoking   Increase activity slowly   Complete by: As directed       Allergies as of 05/20/2023       Reactions   Other Hives, Anaphylaxis   TREE NUTS   Peanut-containing Drug Products Anaphylaxis, Hives        Medication List     TAKE these medications    albuterol 108 (90 Base) MCG/ACT inhaler Commonly known as: VENTOLIN HFA Inhale 2 puffs into the lungs every 6 (six) hours as needed for wheezing or shortness of breath.   albuterol (2.5 MG/3ML) 0.083% nebulizer solution Commonly known as: PROVENTIL Take 2.5 mg by nebulization every 6 (six) hours as needed for wheezing or shortness of breath.   beclomethasone 80 MCG/ACT inhaler Commonly known as: QVAR Inhale 1 puff into the lungs 2 (two) times daily.   EPINEPHrine 0.3 mg/0.3 mL Soaj injection Commonly known as: EPI-PEN Inject 0.3 mg into the muscle Once PRN.   levocetirizine 5 MG tablet Commonly known as: XYZAL Take 5 mg by mouth every evening.   predniSONE 20 MG tablet Commonly known as: DELTASONE Take 40 mg by mouth daily.        Follow-up Information     Dial, Jon Billings, MD. Schedule an appointment as soon as possible for a visit in 1  week(s).   Specialty: Pediatrics Contact information: [address] [place] [address] [phone]                Allergies  Allergen Reactions   Other Hives and Anaphylaxis    TREE NUTS   Peanut-Containing Drug Products Anaphylaxis and Hives    Consultations: Cardiothoracic  surgery   Procedures/Studies: DG ESOPHAGUS W SINGLE CM (SOL OR THIN BA)  Result Date: 05/19/2023 CLINICAL DATA:  Patient with history of asthma who developed acute chest tightness and dyspnea yesterday morning prompting ED evaluation. CXR showed possible pneumomediastinum, follow up CT chest confirms moderate volume pneumomediastinum, unable to rule out esophageal perforation. Request for esophagram to evaluate for possible esophageal tear. Per patient no chest pain, dyspnea, wheezing today. EXAM: ESOPHAGUS/BARIUM SWALLOW/TABLET STUDY TECHNIQUE: Single contrast examination was performed using thin liquid barium. This exam was performed by Lynnette Caffey, PA-C, and was supervised and interpreted by Acquanetta Belling, MD. FLUOROSCOPY: Radiation Exposure Index (as provided by the fluoroscopic device): 2.90 mGy Kerma COMPARISON:  CT chest w/contrast 05/18/23 FINDINGS: Swallowing: Appears normal. No vestibular penetration or aspiration seen. Pharynx: Unremarkable. Esophagus: Normal appearance.  No contrast extravasation. Esophageal motility: Within normal limits on problem focused exam. Hiatal Hernia: None. Gastroesophageal reflux: None visualized. Ingested 13 mm barium tablet: Not given on problem focused exam. Other: None. IMPRESSION: Normal esophagram without evidence of contrast extravasation. Electronically Signed   By: Acquanetta Belling M.D.   On: 05/19/2023 10:11   CT Chest W Contrast  Result Date: 05/18/2023 CLINICAL DATA:  Recent diagnosis of pneumomediastinum. Possible esophageal perforation. Shortness of breath and chest pain. 17 year old male. EXAM: CT CHEST WITH CONTRAST TECHNIQUE: Multidetector CT imaging of the chest was performed during intravenous contrast administration. RADIATION DOSE REDUCTION: This exam was performed according to the departmental dose-optimization program which includes automated exposure control, adjustment of the mA and/or kV according to patient size and/or use of iterative  reconstruction technique. CONTRAST:  75mL OMNIPAQUE IOHEXOL 300 MG/ML  SOLN COMPARISON:  Last PA and lateral chest was 12/01/2020. No prior cross-sectional imaging for comparison. FINDINGS: Cardiovascular: No significant vascular findings. Normal heart size. No pericardial effusion. Mediastinum/Nodes: There is moderate pneumomediastinum outlining structures in the anterior, superior and the middle mediastinum including surrounding the thoracic esophagus, the intrathoracic great vessels and both main bronchi as well as tracking into the prevascular space with trace amount anterior to the heart. Source of the air is indeterminate. The walls of the trachea and main bronchi are grossly intact. There is no visible defect in the esophagus which is slightly air distended, although a small perforation could be occult on imaging. Lungs/Pleura: No pleural effusion, thickening or pneumothorax. Trace left apicomedial pneumothorax is noted involving approximately 1% of the left chest volume, without mediastinal shift. No right pneumothorax is seen. There is diffuse bronchial thickening. There are small areas of ground-glass disease in the base of the right middle lobe medially and in the medial basal left lower lobe, which could be areas of ground-glass pneumonitis or post pneumonic ground-glass scarring. If there was history of recent chest trauma these could be areas of contusion. The remaining lungs are otherwise entirely clear. Upper Abdomen: No acute abnormality. No free air is seen in the upper abdomen. Musculoskeletal: No regional skeletal fracture is evident. No other significant osseous findings. The chest wall is unremarkable. IMPRESSION: 1. Moderate pneumomediastinum outlining the thoracic esophagus, the intrathoracic great vessels and both main bronchi as well as tracking into the prevascular space with trace  amount anterior to the heart. Source of the air is indeterminate. No visible defect in the esophagus which is  slightly air distended, although a small perforation could be occult on imaging. 2. Trace left apicomedial pneumothorax involving approximately 1% of the left chest volume, without mediastinal shift. 3. Small areas of ground-glass disease in the base of the right middle lobe medially and in the medial basal left lower lobe, which could be areas of ground-glass pneumonitis or post pneumonic ground-glass scarring. If there was history of recent chest trauma these could be areas of contusion. 4. Diffuse bronchial thickening. Electronically Signed   By: Almira Bar M.D.   On: 05/18/2023 05:06      Subjective: Patient seen and examined the bedside today.  Hemodynamically stable for discharge. Denies any shortness of breath, cough or wheezing. discharge plan discussed with mother at bedside  Discharge Exam: Vitals:   05/20/23 0744 05/20/23 0834  BP: 122/66   Pulse: (!) 51   Resp: 18   Temp: 97.8 F (36.6 C)   SpO2: 100% 95%   Vitals:   05/20/23 0010 05/20/23 0519 05/20/23 0744 05/20/23 0834  BP: (!) 113/59 128/76 122/66   Pulse: 69 (!) 49 (!) 51   Resp: 18 18 18    Temp: 97.6 F (36.4 C) (!) 97.4 F (36.3 C) 97.8 F (36.6 C)   TempSrc: Oral Oral Oral   SpO2: 99% 100% 100% 95%  Weight:      Height:        General: Pt is alert, awake, not in acute distress Cardiovascular: RRR, S1/S2 +, no rubs, no gallops Respiratory: CTA bilaterally, no wheezing, no rhonchi Abdominal: Soft, NT, ND, bowel sounds + Extremities: no edema, no cyanosis    The results of significant diagnostics from this hospitalization (including imaging, microbiology, ancillary and laboratory) are listed below for reference.     Microbiology: Recent Results (from the past 240 hour(s))  Respiratory (~20 pathogens) panel by PCR     Status: Abnormal   Collection Time: 05/18/23  2:00 PM   Specimen: Nasopharyngeal Swab; Respiratory  Result Value Ref Range Status   Adenovirus NOT DETECTED NOT DETECTED Final    Coronavirus 229E NOT DETECTED NOT DETECTED Final    Comment: (NOTE) The Coronavirus on the Respiratory Panel, DOES NOT test for the novel  Coronavirus (2019 nCoV)    Coronavirus HKU1 NOT DETECTED NOT DETECTED Final   Coronavirus NL63 NOT DETECTED NOT DETECTED Final   Coronavirus OC43 NOT DETECTED NOT DETECTED Final   Metapneumovirus NOT DETECTED NOT DETECTED Final   Rhinovirus / Enterovirus DETECTED (A) NOT DETECTED Final   Influenza A NOT DETECTED NOT DETECTED Final   Influenza B NOT DETECTED NOT DETECTED Final   Parainfluenza Virus 1 NOT DETECTED NOT DETECTED Final   Parainfluenza Virus 2 NOT DETECTED NOT DETECTED Final   Parainfluenza Virus 3 NOT DETECTED NOT DETECTED Final   Parainfluenza Virus 4 NOT DETECTED NOT DETECTED Final   Respiratory Syncytial Virus NOT DETECTED NOT DETECTED Final   Bordetella pertussis NOT DETECTED NOT DETECTED Final   Bordetella Parapertussis NOT DETECTED NOT DETECTED Final   Chlamydophila pneumoniae NOT DETECTED NOT DETECTED Final   Mycoplasma pneumoniae NOT DETECTED NOT DETECTED Final    Comment: Performed at [facility], [address] [place], [place] [address]  SARS Coronavirus 2 by RT PCR (hospital order, performed in [facility] hospital lab) *cepheid single result test* Anterior Nasal Swab     Status: None   Collection Time:  05/18/23  6:02 PM   Specimen: Anterior Nasal Swab  Result Value Ref Range Status   SARS Coronavirus 2 by RT PCR NEGATIVE NEGATIVE Final    Comment: Performed at [facility], [address] [place], [place] [address]     Labs: BNP (last 3 results) No results for input(s): "BNP" in the last 8760 hours. Basic Metabolic Panel: Recent Labs  Lab 05/18/23 0300  NA 137  K 4.0  CL 102  CO2 26  GLUCOSE 100*  BUN 14  CREATININE 1.21  CALCIUM 9.5   Liver Function Tests: Recent Labs  Lab 05/18/23 0300  AST 36  ALT 20  ALKPHOS 115  BILITOT 1.0  PROT 7.6  ALBUMIN 4.6   No results for input(s):  "LIPASE", "AMYLASE" in the last 168 hours. No results for input(s): "AMMONIA" in the last 168 hours. CBC: Recent Labs  Lab 05/18/23 0300 05/19/23 0610  WBC 10.2 6.5  HGB 14.1 14.2  HCT 42.5 43.7  MCV 90.8 91.2  PLT 224 236   Cardiac Enzymes: No results for input(s): "CKTOTAL", "CKMB", "CKMBINDEX", "TROPONINI" in the last 168 hours. BNP: Invalid input(s): "POCBNP" CBG: No results for input(s): "GLUCAP" in the last 168 hours. D-Dimer No results for input(s): "DDIMER" in the last 72 hours. Hgb A1c No results for input(s): "HGBA1C" in the last 72 hours. Lipid Profile No results for input(s): "CHOL", "HDL", "LDLCALC", "TRIG", "CHOLHDL", "LDLDIRECT" in the last 72 hours. Thyroid function studies No results for input(s): "TSH", "T4TOTAL", "T3FREE", "THYROIDAB" in the last 72 hours.  Invalid input(s): "FREET3" Anemia work up No results for input(s): "VITAMINB12", "FOLATE", "FERRITIN", "TIBC", "IRON", "RETICCTPCT" in the last 72 hours. Urinalysis No results found for: "COLORURINE", "APPEARANCEUR", "LABSPEC", "PHURINE", "GLUCOSEU", "HGBUR", "BILIRUBINUR", "KETONESUR", "PROTEINUR", "UROBILINOGEN", "NITRITE", "LEUKOCYTESUR" Sepsis Labs Recent Labs  Lab 05/18/23 0300 05/19/23 0610  WBC 10.2 6.5   Microbiology Recent Results (from the past 240 hour(s))  Respiratory (~20 pathogens) panel by PCR     Status: Abnormal   Collection Time: 05/18/23  2:00 PM   Specimen: Nasopharyngeal Swab; Respiratory  Result Value Ref Range Status   Adenovirus NOT DETECTED NOT DETECTED Final   Coronavirus 229E NOT DETECTED NOT DETECTED Final    Comment: (NOTE) The Coronavirus on the Respiratory Panel, DOES NOT test for the novel  Coronavirus (2019 nCoV)    Coronavirus HKU1 NOT DETECTED NOT DETECTED Final   Coronavirus NL63 NOT DETECTED NOT DETECTED Final   Coronavirus OC43 NOT DETECTED NOT DETECTED Final   Metapneumovirus NOT DETECTED NOT DETECTED Final   Rhinovirus / Enterovirus DETECTED (A) NOT  DETECTED Final   Influenza A NOT DETECTED NOT DETECTED Final   Influenza B NOT DETECTED NOT DETECTED Final   Parainfluenza Virus 1 NOT DETECTED NOT DETECTED Final   Parainfluenza Virus 2 NOT DETECTED NOT DETECTED Final   Parainfluenza Virus 3 NOT DETECTED NOT DETECTED Final   Parainfluenza Virus 4 NOT DETECTED NOT DETECTED Final   Respiratory Syncytial Virus NOT DETECTED NOT DETECTED Final   Bordetella pertussis NOT DETECTED NOT DETECTED Final   Bordetella Parapertussis NOT DETECTED NOT DETECTED Final   Chlamydophila pneumoniae NOT DETECTED NOT DETECTED Final   Mycoplasma pneumoniae NOT DETECTED NOT DETECTED Final    Comment: Performed at [facility], [address], [place], [place] [address]  SARS Coronavirus 2 by RT PCR (hospital order, performed in [facility] hospital lab) *cepheid single result test* Anterior Nasal Swab     Status: None   Collection Time:  05/18/23  6:02 PM   Specimen: Anterior Nasal Swab  Result Value Ref Range Status   SARS Coronavirus 2 by RT PCR NEGATIVE NEGATIVE Final    Comment: Performed at [facility], [address], [place], [place] [address]    Please note: You were cared for by a hospitalist during your hospital stay. Once you are discharged, your primary care physician will handle any further medical issues. Please note that NO REFILLS for any discharge medications will be authorized once you are discharged, as it is imperative that you return to your primary care physician (or establish a relationship with a primary care physician if you do not have one) for your post hospital discharge needs so that they can reassess your need for medications and monitor your lab values.    Time coordinating discharge: 40 minutes  SIGNED:   Burnadette Pop, MD  [facility] 05/20/2023, 10:12 AM Pager [phone]  If 7PM-7AM, please contact night-coverage [email or web address] Password [email or web address]

## 2023-05-20 NOTE — Progress Notes (Signed)
Telemetry unit called with patient being bradycardic with HR 39. Upon assessment, patient was found playing with his phone and resting in bed, he denies symptoms. [name], DO was notified.   Calil Amor
